# Patient Record
Sex: Female | Born: 2000 | Race: White | Hispanic: No | Marital: Single | Smoking: Never smoker
Health system: Southern US, Community
[De-identification: ages and names within clinical notes are randomized; demographics above are authoritative.]

## PROBLEM LIST (undated history)

## (undated) DIAGNOSIS — J45909 Unspecified asthma, uncomplicated: Secondary | ICD-10-CM

---

## 2019-09-05 ENCOUNTER — Other Ambulatory Visit: Payer: Self-pay

## 2019-09-05 DIAGNOSIS — Z20822 Contact with and (suspected) exposure to covid-19: Secondary | ICD-10-CM

## 2019-09-06 LAB — NOVEL CORONAVIRUS, NAA: SARS-CoV-2, NAA: NOT DETECTED

## 2020-03-04 ENCOUNTER — Other Ambulatory Visit: Payer: Self-pay

## 2020-03-04 ENCOUNTER — Ambulatory Visit: Payer: Self-pay | Attending: Internal Medicine

## 2020-03-04 DIAGNOSIS — Z23 Encounter for immunization: Secondary | ICD-10-CM

## 2020-03-04 NOTE — Progress Notes (Signed)
   Covid-19 Vaccination Clinic  Name:  Chanice Brenton    MRN: [id number] DOB: [birth year]  03/04/2020  Ms. Picchi was observed post Covid-19 immunization for 15 minutes without incident. She was provided with Vaccine Information Sheet and instruction to access the V-Safe system.   Ms. Ojo was instructed to call 911 with any severe reactions post vaccine: Marland Kitchen Difficulty breathing  . Swelling of face and throat  . A fast heartbeat  . A bad rash all over body  . Dizziness and weakness   Immunizations Administered    Name Date Dose VIS Date Route   Pfizer COVID-19 Vaccine 03/04/2020  3:48 PM 0.3 mL 11/24/2019 Intramuscular   Manufacturer: [employer], [employer]   Lot: [id number]   NDC: [address]-8

## 2020-03-06 DIAGNOSIS — U071 COVID-19: Secondary | ICD-10-CM

## 2020-03-06 HISTORY — DX: COVID-19: U07.1

## 2020-03-29 ENCOUNTER — Ambulatory Visit: Payer: Self-pay | Attending: Internal Medicine

## 2020-03-29 DIAGNOSIS — Z23 Encounter for immunization: Secondary | ICD-10-CM

## 2020-03-29 NOTE — Progress Notes (Signed)
   Covid-19 Vaccination Clinic  Name:  Amy Hunt    MRN: [id number] DOB: May 20, 2001  03/29/2020  [name] was observed post Covid-19 immunization for 15 minutes without incident. She was provided with Vaccine Information Sheet and instruction to access the V-Safe system.   [name] was instructed to call 911 with any severe reactions post vaccine: Marland Kitchen Difficulty breathing  . Swelling of face and throat  . A fast heartbeat  . A bad rash all over body  . Dizziness and weakness   Immunizations Administered    Name Date Dose VIS Date Route   Pfizer COVID-19 Vaccine 03/29/2020  3:43 PM 0.3 mL 11/24/2019 Intramuscular   Manufacturer: [employer], [employer]   Lot: [id number]   NDC: [address]-9

## 2020-04-07 ENCOUNTER — Other Ambulatory Visit: Payer: Self-pay

## 2020-04-07 ENCOUNTER — Emergency Department
Admission: EM | Admit: 2020-04-07 | Discharge: 2020-04-07 | Disposition: A | Discharge status: Home / Self Care | Payer: BLUE CROSS/BLUE SHIELD | Attending: Emergency Medicine | Admitting: Emergency Medicine

## 2020-04-07 ENCOUNTER — Emergency Department: Payer: BLUE CROSS/BLUE SHIELD

## 2020-04-07 DIAGNOSIS — Z8616 Personal history of COVID-19: Secondary | ICD-10-CM | POA: Insufficient documentation

## 2020-04-07 DIAGNOSIS — R079 Chest pain, unspecified: Secondary | ICD-10-CM | POA: Diagnosis present

## 2020-04-07 DIAGNOSIS — R1084 Generalized abdominal pain: Secondary | ICD-10-CM | POA: Diagnosis not present

## 2020-04-07 DIAGNOSIS — R110 Nausea: Secondary | ICD-10-CM | POA: Insufficient documentation

## 2020-04-07 DIAGNOSIS — J45909 Unspecified asthma, uncomplicated: Secondary | ICD-10-CM | POA: Diagnosis not present

## 2020-04-07 HISTORY — DX: Unspecified asthma, uncomplicated: J45.909

## 2020-04-07 LAB — URINALYSIS, COMPLETE (UACMP) WITH MICROSCOPIC
Bacteria, UA: NONE SEEN
Bilirubin Urine: NEGATIVE
Glucose, UA: NEGATIVE mg/dL
Ketones, ur: NEGATIVE mg/dL
Nitrite: NEGATIVE
Protein, ur: NEGATIVE mg/dL
Specific Gravity, Urine: 1.02 (ref 1.005–1.030)
pH: 7 (ref 5.0–8.0)

## 2020-04-07 LAB — CBC
HCT: 40.5 % (ref 36.0–46.0)
Hemoglobin: 13.2 g/dL (ref 12.0–15.0)
MCH: 28.8 pg (ref 26.0–34.0)
MCHC: 32.6 g/dL (ref 30.0–36.0)
MCV: 88.2 fL (ref 80.0–100.0)
Platelets: 249 10*3/uL (ref 150–400)
RBC: 4.59 MIL/uL (ref 3.87–5.11)
RDW: 14 % (ref 11.5–15.5)
WBC: 8.4 10*3/uL (ref 4.0–10.5)
nRBC: 0 % (ref 0.0–0.2)

## 2020-04-07 LAB — COMPREHENSIVE METABOLIC PANEL
ALT: 15 U/L (ref 0–44)
AST: 24 U/L (ref 15–41)
Albumin: 4.2 g/dL (ref 3.5–5.0)
Alkaline Phosphatase: 50 U/L (ref 38–126)
Anion gap: 10 (ref 5–15)
BUN: 10 mg/dL (ref 6–20)
CO2: 25 mmol/L (ref 22–32)
Calcium: 9.3 mg/dL (ref 8.9–10.3)
Chloride: 103 mmol/L (ref 98–111)
Creatinine, Ser: 0.76 mg/dL (ref 0.44–1.00)
GFR calc Af Amer: >60 mL/min (ref >60)
GFR calc non Af Amer: >60 mL/min (ref >60)
Glucose, Bld: 117 mg/dL — ABNORMAL HIGH (ref 70–99)
Potassium: 3.5 mmol/L (ref 3.5–5.1)
Sodium: 138 mmol/L (ref 135–145)
Total Bilirubin: 0.5 mg/dL (ref 0.3–1.2)
Total Protein: 8 g/dL (ref 6.5–8.1)

## 2020-04-07 LAB — LIPASE, BLOOD: Lipase: 27 U/L (ref 11–51)

## 2020-04-07 LAB — POCT PREGNANCY, URINE
Preg Test, Ur: NEGATIVE
Preg Test, Ur: NEGATIVE

## 2020-04-07 LAB — TROPONIN I (HIGH SENSITIVITY): Troponin I (High Sensitivity): <2 ng/L (ref <18)

## 2020-04-07 MED ORDER — ONDANSETRON 4 MG PO TBDP: ORAL_TABLET | ORAL | 0 refills | Status: AC

## 2020-04-07 NOTE — Discharge Instructions (Addendum)
Your workup in the Emergency Department today was reassuring.  We did not find any specific abnormalities.  We recommend you drink plenty of fluids, take your regular medications and/or any new ones prescribed today, and follow up with the doctor(s) listed in these documents as recommended.  Return to the Emergency Department if you develop new or worsening symptoms that concern you.  

## 2020-04-07 NOTE — ED Triage Notes (Signed)
Reports generalized abdominal pain throughout day Saturday and chest pain starting approx 30 minutes ago. Denies NVD. No urinary sx.

## 2020-04-07 NOTE — ED Provider Notes (Signed)
[facility] Emergency Department Provider Note  ____________________________________________   First MD Initiated Contact with Patient 04/07/20 [phone]     (approximate)  I have reviewed the triage vital signs and the nursing notes.   HISTORY  Chief Complaint Abdominal Pain and Chest Pain    HPI Amy Hunt is a 19 y.o. female with medical history as listed below who presents with a friend for evaluation of right of symptoms including some abdominal discomfort that occurred after eating a meal.  She took some ibuprofen after went on for couple of hours and then she had acute onset of what she describes as severe and sharp chest pain.  This scared her because she has never had symptoms like it before.  She has been waiting for about 4 hours in the emergency department for an exam room and the symptoms have completely resolved including both the chest and the abdominal pain.  She has some nausea but denies vomiting and diarrhea.  Nothing in particular made the symptoms better or worse and they were severe at their worst.  She has no history of blood clots in the legs of the lungs and she was diagnosed with COVID-19 at [facility] about a month ago but had only mild symptoms that resolved within a few days.         Past Medical History:  Diagnosis Date  . Asthma   . COVID-19 03/06/2020   tested + at [facility] on 03/06/2020    There are no problems to display for this patient.   History reviewed. No pertinent surgical history.  Prior to Admission medications   Medication Sig Start Date End Date Taking? Authorizing Provider  ondansetron (ZOFRAN ODT) 4 MG disintegrating tablet Allow 1-2 tablets to dissolve in your mouth every 8 hours as needed for nausea/vomiting 04/07/20   Loleta Rose, MD    Allergies Patient has no known allergies.  History reviewed. No pertinent family history.  Social History Social History   Tobacco Use  . Smoking status: Never Smoker    . Smokeless tobacco: Never Used  Substance Use Topics  . Alcohol use: Not Currently  . Drug use: Not on file    Review of Systems Constitutional: No fever/chills Eyes: No visual changes. ENT: No sore throat. Cardiovascular: +chest pain. Respiratory: Denies shortness of breath. Gastrointestinal: +abdominal pain.  Nausea, no vomiting.  No diarrhea.  No constipation. Genitourinary: Negative for dysuria. Musculoskeletal: Negative for neck pain.  Negative for back pain. Integumentary: Negative for rash. Neurological: Negative for headaches, focal weakness or numbness.   ____________________________________________   PHYSICAL EXAM:  VITAL SIGNS: ED Triage Vitals [04/07/20 0103]  Enc Vitals Group     BP (!) 135/92     Pulse Rate 86     Resp 18     Temp 98 F (36.7 C)     Temp Source Oral     SpO2 100 %     Weight 59 kg (130 lb)     Height 1.778 m (5\' 10" )     Head Circumference      Peak Flow      Pain Score 4     Pain Loc      Pain Edu?      Excl. in GC?     Constitutional: Alert and oriented.  Well-appearing and in no acute distress. Eyes: Conjunctivae are normal.  Head: Atraumatic. Nose: No congestion/rhinnorhea. Mouth/Throat: Patient is wearing a mask. Neck: No stridor.  No meningeal signs.  Cardiovascular: Normal rate, regular rhythm. Good peripheral circulation. Grossly normal heart sounds. Respiratory: Normal respiratory effort.  No retractions. Gastrointestinal: Soft and nontender. No distention.  Negative Murphy sign.  No tenderness, rebound or otherwise, in the right lower quadrant.  Negative Rovsing sign Musculoskeletal: No lower extremity tenderness nor edema. No gross deformities of extremities. Neurologic:  Normal speech and language. No gross focal neurologic deficits are appreciated.  Skin:  Skin is warm, dry and intact. Psychiatric: Mood and affect are normal. Speech and behavior are normal.  ____________________________________________    LABS (all labs ordered are listed, but only abnormal results are displayed)  Labs Reviewed  COMPREHENSIVE METABOLIC PANEL - Abnormal; Notable for the following components:      Result Value   Glucose, Bld 117 (*)    All other components within normal limits  URINALYSIS, COMPLETE (UACMP) WITH MICROSCOPIC - Abnormal; Notable for the following components:   Hgb urine dipstick SMALL (*)    Leukocytes,Ua TRACE (*)    All other components within normal limits  LIPASE, BLOOD  CBC  POC URINE PREG, ED  POCT PREGNANCY, URINE  POCT PREGNANCY, URINE  TROPONIN I (HIGH SENSITIVITY)   ____________________________________________  EKG  ED ECG REPORT I, Hinda Kehr, the attending physician, personally viewed and interpreted this ECG.  Date: 04/07/2020 EKG Time: 1:02 AM Rate: 80 Rhythm: normal sinus rhythm with sinus arrhythmia QRS Axis: normal Intervals: normal ST/T Wave abnormalities: normal Narrative Interpretation: no evidence of acute ischemia  ____________________________________________  RADIOLOGY I, Hinda Kehr, personally viewed and evaluated these images (plain radiographs) as part of my medical decision making, as well as reviewing the written report by the radiologist.  ED MD interpretation: No acute abnormalities on chest x-ray.  Official radiology report(s): DG Chest 2 View  Result Date: 04/07/2020 CLINICAL DATA:  Chest pain EXAM: CHEST - 2 VIEW COMPARISON:  None. FINDINGS: The heart size and mediastinal contours are within normal limits. Both lungs are clear. The visualized skeletal structures are unremarkable. IMPRESSION: No active cardiopulmonary disease. Electronically Signed   By: Prudencio Pair M.D.   On: 04/07/2020 01:34    ____________________________________________   PROCEDURES   Procedure(s) performed (including Critical Care):  Procedures   ____________________________________________   INITIAL IMPRESSION / MDM / [place] / ED  COURSE  As part of my medical decision making, I reviewed the following data within the [place] notes reviewed and incorporated, Labs reviewed , EKG interpreted , Old chart reviewed, Radiograph reviewed  and Notes from prior ED visits   Differential diagnosis includes, but is not limited to, viral infection, foodborne pathogen, biliary disease, appendicitis, mesenteric adenitis, PE, pneumonia.  Nonischemic EKG, no acute abnormalities identified on urinalysis and the patient had no dysuria, PERC negative, normal comprehensive metabolic panel and lipase, normal high-sensitivity troponin with very low HEAR score.  The patient is currently asymptomatic and has been so for an extended period of time during her 4+ hour wait in the emergency department.  She is comfortable with the plan for discharge back to [facility] and will follow up as an outpatient.  I think this is appropriate and there is no evidence of an emergent medical condition at this time.  I am providing a prescription for Zofran in case she has some additional nausea.  I gave my usual and customary return precautions.          ____________________________________________  FINAL CLINICAL IMPRESSION(S) / ED DIAGNOSES  Final diagnoses:  Generalized abdominal pain  Chest  pain, unspecified type     MEDICATIONS GIVEN DURING THIS VISIT:  Medications - No data to display   ED Discharge Orders         Ordered    ondansetron (ZOFRAN ODT) 4 MG disintegrating tablet     04/07/20 0524          *Please note:  Amy Hunt was evaluated in Emergency Department on 04/07/2020 for the symptoms described in the history of present illness. She was evaluated in the context of the global COVID-19 pandemic, which necessitated consideration that the patient might be at risk for infection with the SARS-CoV-2 virus that causes COVID-19. Institutional protocols and algorithms that pertain to the evaluation of patients at  risk for COVID-19 are in a state of rapid change based on information released by regulatory bodies including the CDC and federal and state organizations. These policies and algorithms were followed during the patient's care in the ED.  Some ED evaluations and interventions may be delayed as a result of limited staffing during the pandemic.*  Note:  This document was prepared using Dragon voice recognition software and may include unintentional dictation errors.   Loleta Rose, MD 04/07/20 [phone]

## 2020-11-21 IMAGING — CR DG CHEST 2V
2 series · 2 of 2 positions shown · non-contrast
Comparison: None.

CLINICAL DATA: Chest pain

EXAM:
CHEST - 2 VIEW

[chest pa]
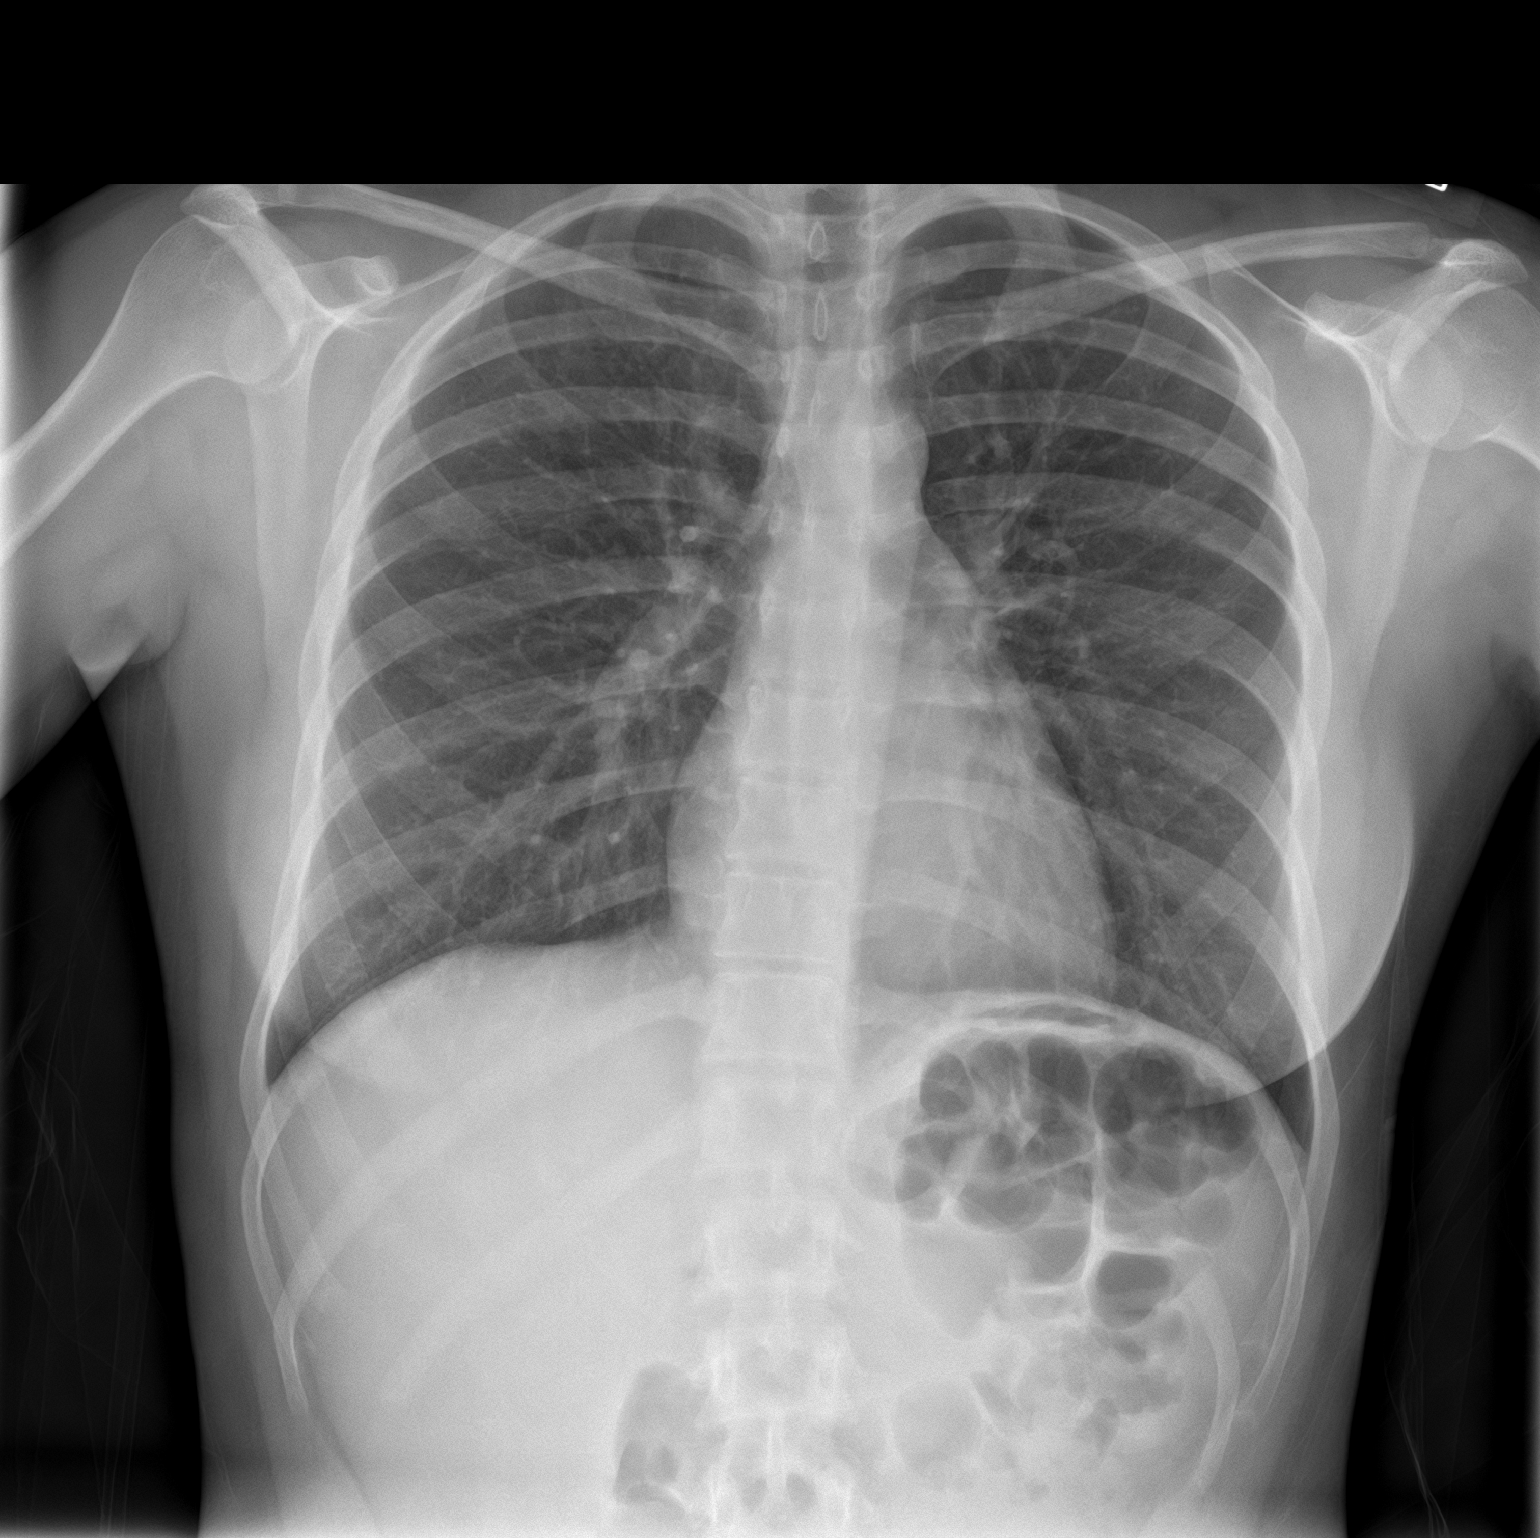

[chest lat]
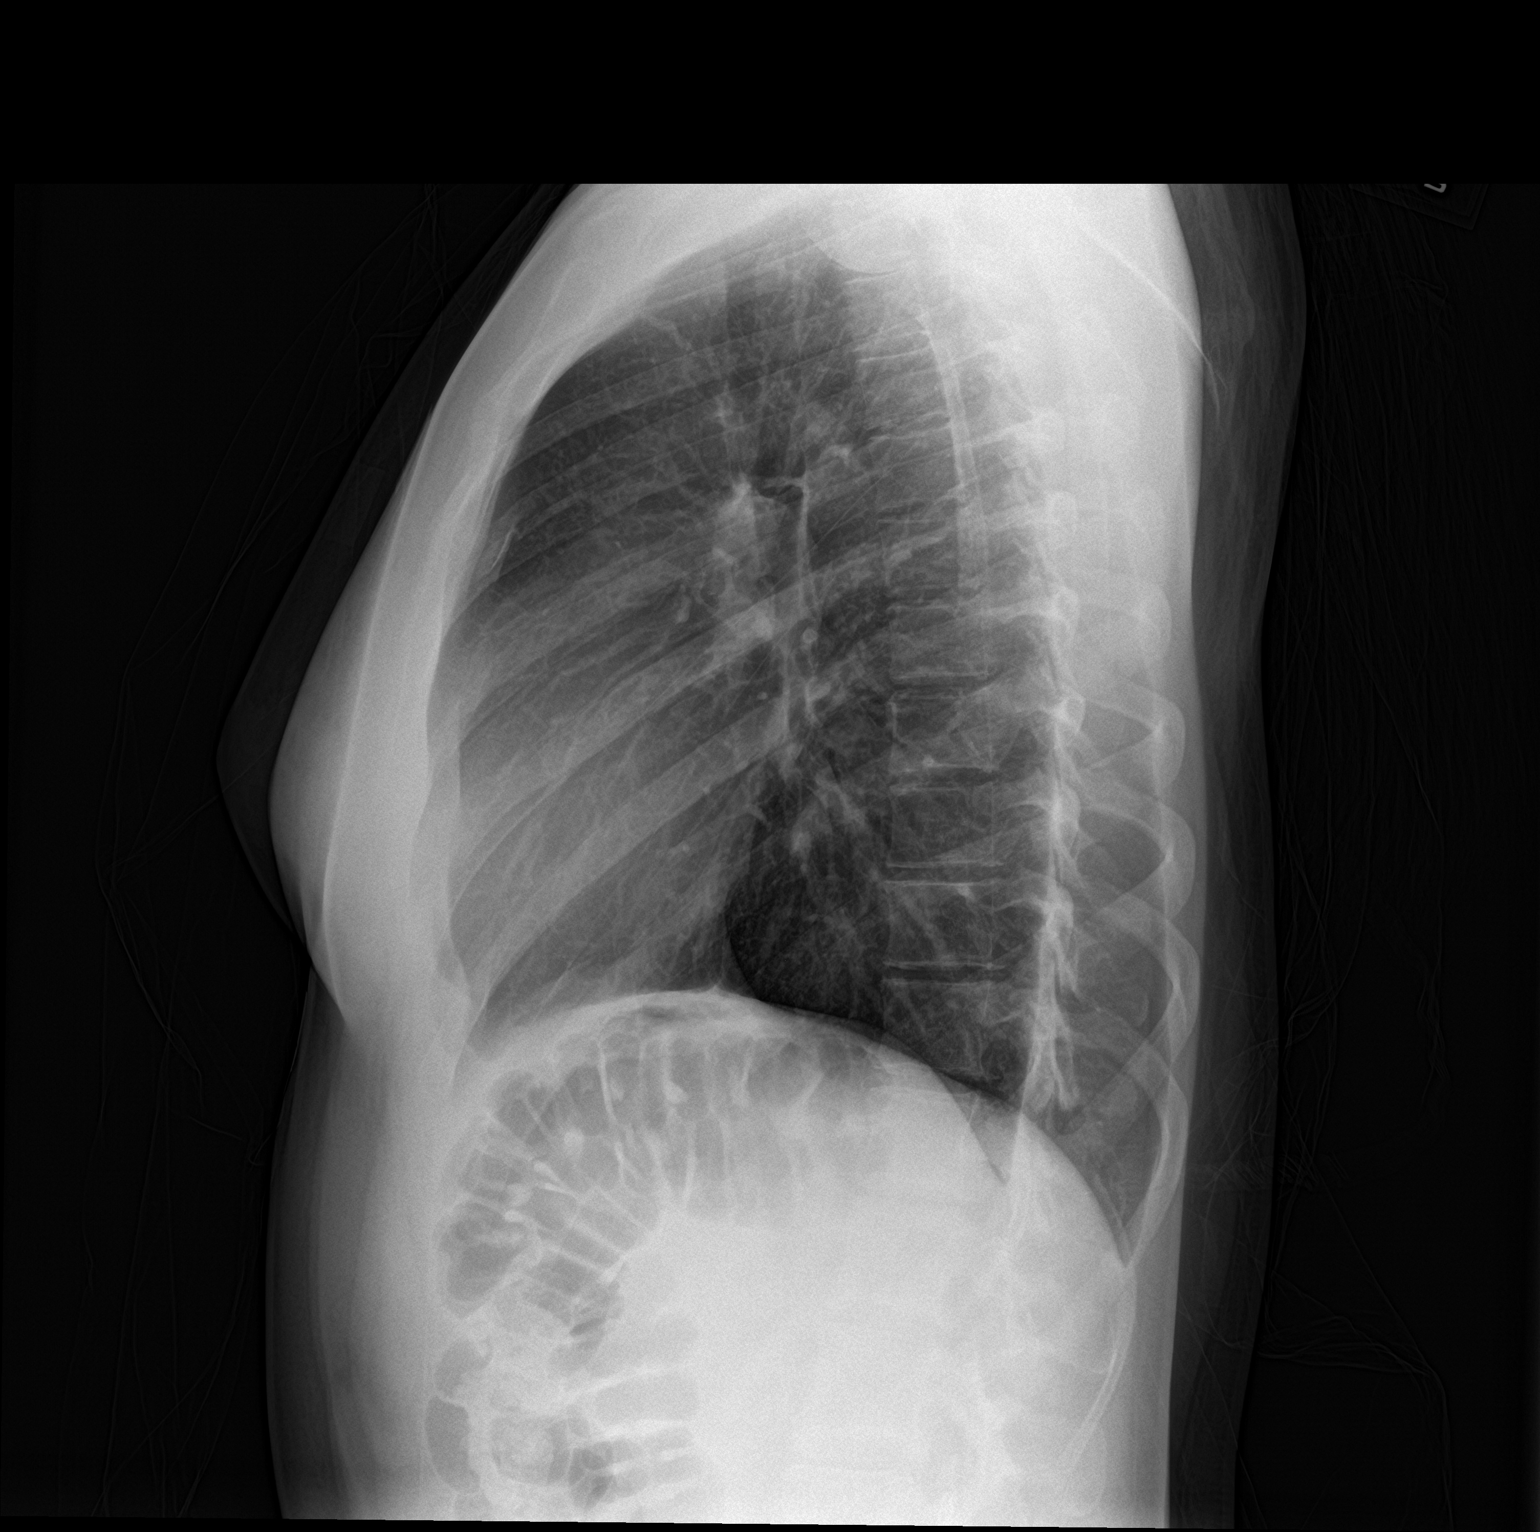

[2 of 2 positions shown; findings below may reference images not displayed]

FINDINGS: The heart size and mediastinal contours are within normal limits.
Both lungs are clear. The visualized skeletal structures are
unremarkable.
IMPRESSION: No active cardiopulmonary disease.
